# Patient Record
Sex: Male | Born: 1995 | Race: White | Hispanic: No | Marital: Married | Smoking: Never smoker
Health system: Southern US, Community
[De-identification: ages and names within clinical notes are randomized; demographics above are authoritative.]

---
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
## 2019-10-10 ENCOUNTER — Encounter: Payer: Self-pay | Admitting: Podiatry

## 2019-10-10 ENCOUNTER — Ambulatory Visit (INDEPENDENT_AMBULATORY_CARE_PROVIDER_SITE_OTHER): Payer: BC Managed Care – PPO

## 2019-10-10 ENCOUNTER — Other Ambulatory Visit: Payer: Self-pay | Admitting: Podiatry

## 2019-10-10 ENCOUNTER — Ambulatory Visit (INDEPENDENT_AMBULATORY_CARE_PROVIDER_SITE_OTHER): Payer: BC Managed Care – PPO | Admitting: Podiatry

## 2019-10-10 ENCOUNTER — Other Ambulatory Visit: Payer: Self-pay

## 2019-10-10 DIAGNOSIS — M659 Synovitis and tenosynovitis, unspecified: Secondary | ICD-10-CM

## 2019-10-10 DIAGNOSIS — M65979 Unspecified synovitis and tenosynovitis, unspecified ankle and foot: Secondary | ICD-10-CM

## 2019-10-10 DIAGNOSIS — M779 Enthesopathy, unspecified: Secondary | ICD-10-CM

## 2019-10-10 MED ORDER — DICLOFENAC SODIUM 75 MG PO TBEC: 75.0000 mg | DELAYED_RELEASE_TABLET | Freq: Two times a day (BID) | ORAL | 2 refills | Status: DC

## 2019-10-11 NOTE — Progress Notes (Signed)
Subjective:   Patient ID: Jeremy Silva, male   DOB: 24 y.o.   MRN: [id number]   HPI Patient states my right ankle has been swelling and it pops when I am flexing.  Patient states throbs worse when sitting does not remember specific injury and has been going on for several months.  Patient does not smoke and would like to be more active   Review of Systems  All other systems reviewed and are negative.       Objective:  Physical Exam Vitals and nursing note reviewed.  Constitutional:      Appearance: He is well-developed.  Pulmonary:     Effort: Pulmonary effort is normal.  Musculoskeletal:        General: Normal range of motion.  Skin:    General: Skin is warm.  Neurological:     Mental Status: He is alert.     Neurovascular status intact muscle strength was found to be adequate range of motion within normal limits.  Patient has exquisite discomfort in the sinus tarsi right with inflammation fluid and seems to have some slight reduction of motion subtalar joint.  Mild equinus condition noted bilateral with no other significant pathology     Assessment:  Possibility for sinus tarsitis acute nature right versus arthritis or other bone pathology or soft tissue pathology     Plan:  H&P conditions reviewed and today I did sterile prep and injected the sinus tarsi right 3 mg Kenalog 5 mg Xylocaine applied fascial brace to reduce motion and instructed on gradual increase in activity levels.  Placed on diclofenac 75 mg twice daily and reappoint 3 weeks  X-ray indicates no signs of fracture or advanced arthritic process

## 2019-10-21 ENCOUNTER — Other Ambulatory Visit: Payer: Self-pay

## 2019-10-21 ENCOUNTER — Emergency Department (HOSPITAL_COMMUNITY)
Admission: EM | Admit: 2019-10-21 | Discharge: 2019-10-21 | Disposition: A | Discharge status: Home / Self Care | Payer: BC Managed Care – PPO | Attending: Emergency Medicine | Admitting: Emergency Medicine

## 2019-10-21 ENCOUNTER — Emergency Department (HOSPITAL_COMMUNITY): Payer: BC Managed Care – PPO

## 2019-10-21 DIAGNOSIS — Y999 Unspecified external cause status: Secondary | ICD-10-CM | POA: Insufficient documentation

## 2019-10-21 DIAGNOSIS — S61011A Laceration without foreign body of right thumb without damage to nail, initial encounter: Secondary | ICD-10-CM | POA: Diagnosis present

## 2019-10-21 DIAGNOSIS — Y93G1 Activity, food preparation and clean up: Secondary | ICD-10-CM | POA: Diagnosis not present

## 2019-10-21 DIAGNOSIS — Y929 Unspecified place or not applicable: Secondary | ICD-10-CM | POA: Diagnosis not present

## 2019-10-21 DIAGNOSIS — W274XXA Contact with kitchen utensil, initial encounter: Secondary | ICD-10-CM | POA: Diagnosis not present

## 2019-10-21 DIAGNOSIS — S6990XA Unspecified injury of unspecified wrist, hand and finger(s), initial encounter: Secondary | ICD-10-CM

## 2019-10-21 MED ORDER — CEPHALEXIN 500 MG PO CAPS: 500.0000 mg | ORAL_CAPSULE | Freq: Three times a day (TID) | ORAL | 0 refills | Status: AC

## 2019-10-21 MED ORDER — ACETAMINOPHEN 325 MG PO TABS: 650.0000 mg | ORAL_TABLET | Freq: Four times a day (QID) | ORAL | Status: DC

## 2019-10-21 MED ORDER — MORPHINE SULFATE (PF) 4 MG/ML IV SOLN
4.0000 mg | Freq: Once | INTRAVENOUS | Status: AC
  Administered 2019-10-21: 14:00:00 4 mg via INTRAVENOUS
  Filled 2019-10-21: qty 1

## 2019-10-21 MED ORDER — OXYCODONE HCL 5 MG PO TABS: 5.0000 mg | ORAL_TABLET | Freq: Four times a day (QID) | ORAL | 0 refills | Status: DC | PRN

## 2019-10-21 NOTE — ED Triage Notes (Signed)
Pt reports curring fingertip off with mandolin slicer. Seen at [facility] and given tetanus shot. VSS.

## 2019-10-21 NOTE — ED Provider Notes (Addendum)
[facility] EMERGENCY DEPARTMENT Provider Note   CSN: [id number] Arrival date & time: 10/21/19  1321     History Chief Complaint  Patient presents with  . Finger Injury    Jeremy Silva is a 24 y.o. male with no relevant PMH who presents to the ED with an injury to his right arm.  Patient was reportedly using an ankle and slicer to cut shoes when he sliced off the fat pad of his right thumb.  Patient initially went to an urgent care who subsequently referred him to the ED to rule out bony injury.  Hemostasis has not yet been achieved, despite compression wrap.  Patient denies any lightheadedness or dizziness.  He reports 9 out of 10 pain.  He denies any numbness or tingling, diminished ROM, or other injury.  No history of bleeding disorder or blood thinners.  HPI     No past medical history on file.  There are no problems to display for this patient.   No past surgical history on file.     No family history on file.  Social History   Tobacco Use  . Smoking status: Never Smoker  . Smokeless tobacco: Never Used  Substance Use Topics  . Alcohol use: Not on file  . Drug use: Not on file    Home Medications Prior to Admission medications   Medication Sig Start Date End Date Taking? Authorizing Provider  acetaminophen (TYLENOL) 325 MG tablet Take 2 tablets (650 mg total) by mouth every 6 (six) hours. 10/21/19   Mack Hook, MD  cephALEXin (KEFLEX) 500 MG capsule Take 1 capsule (500 mg total) by mouth 3 (three) times daily for 7 days. 10/21/19 10/28/19  Mack Hook, MD  diclofenac (VOLTAREN) 75 MG EC tablet Take 1 tablet (75 mg total) by mouth 2 (two) times daily. 10/10/19   Lenn Sink, DPM  oxyCODONE (ROXICODONE) 5 MG immediate release tablet Take 1 tablet (5 mg total) by mouth every 6 (six) hours as needed for severe pain. 10/21/19   Mack Hook, MD    Allergies    Patient has no known allergies.  Review of Systems   Review of Systems    Constitutional: Negative for chills and fever.  Musculoskeletal: Negative for joint swelling.  Skin: Positive for wound.  Neurological: Negative for numbness.    Physical Exam Updated Vital Signs BP (!) 158/102 (BP Location: Left Arm)   Pulse 95   Temp 98.4 F (36.9 C) (Oral)   Resp 16   Ht 6\' 2"  (1.88 m)   Wt 108.9 kg   SpO2 100%   BMI 30.81 kg/m   Physical Exam Vitals and nursing note reviewed. Exam conducted with a chaperone present.  Constitutional:      Appearance: Normal appearance.  HENT:     Head: Normocephalic and atraumatic.  Eyes:     General: No scleral icterus.    Conjunctiva/sclera: Conjunctivae normal.  Pulmonary:     Effort: Pulmonary effort is normal.  Musculoskeletal:     Comments: Right hand, thumb: 2 x 2 centimeter bleeding wound distal to IP joint over fat pad.  No nailbed involvement.  Sensation intact proximally.  MCP joint: no diminished ROM or strength.  No spreading erythema or swelling.   Skin:    General: Skin is dry.  Neurological:     Mental Status: He is alert.     GCS: GCS eye subscore is 4. GCS verbal subscore is 5. GCS motor subscore is 6.  Psychiatric:        Mood and Affect: Mood normal.        Behavior: Behavior normal.        Thought Content: Thought content normal.           ED Results / Procedures / Treatments   Labs (all labs ordered are listed, but only abnormal results are displayed) Labs Reviewed - No data to display  EKG None  Radiology DG Finger Thumb Right  Result Date: 10/21/2019 CLINICAL DATA:  Laceration right thumb EXAM: RIGHT THUMB 2+V COMPARISON:  None FINDINGS: Extensive dressing artifacts. Osseous mineralization normal. Joint spaces preserved. No acute fracture, dislocation, or bone destruction. IMPRESSION: No acute osseous abnormalities. Electronically Signed   By: Lavonia Dana M.D.   On: 10/21/2019 14:19    Procedures Procedures (including critical care time)  Medications Ordered in  ED Medications  morphine 4 MG/ML injection 4 mg (4 mg Intravenous Given 10/21/19 1406)    ED Course  I have reviewed the triage vital signs and the nursing notes.  Pertinent labs & imaging results that were available during my care of the patient were reviewed by me and considered in my medical decision making (see chart for details).    MDM Rules/Calculators/A&P                      Hemostasis has not been achieved despite compression wrap.  DG thumb obtained which demonstrated no osseous abnormalities or involvement.  Tetanus Boostrix injection provided at urgent care given his unknown tetanus status.  Consulted hand surgery to evaluate patient.  Hilbert Odor, PA-C and Dr. Grandville Silos, hand surgeon, evaluated patient at bedside.  Plan is for him to follow-up with Dr. Grandville Silos on Thursday, 10/24/19, and then have corrective surgery the following day.  Will wrap with Xeroform dressing, 2 x 2 gauze, and Coban.  Patient is otherwise safe for discharge.  Decided against I-stat 8 as results would likely be unreliable given acuity of his bleeding.  While patient endorses significant bleeding, he denies any lightheadedness, dizziness, or chest discomfort that might otherwise be concerning for anemia.  Patient is hemodynamically stable and in no acute distress. Safe for discharge at this time with outpatient follow-up with Dr. Grandville Silos.  Encouraged to continue elevating his hand.    Final Clinical Impression(s) / ED Diagnoses Final diagnoses:  Thumb injury, initial encounter    Rx / DC Orders ED Discharge Orders         Ordered    acetaminophen (TYLENOL) 325 MG tablet  Every 6 hours     10/21/19 1504    oxyCODONE (ROXICODONE) 5 MG immediate release tablet  Every 6 hours PRN     10/21/19 1504    cephALEXin (KEFLEX) 500 MG capsule  3 times daily     10/21/19 1504           Corena Herter, PA-C 10/21/19 1606    Corena Herter, PA-C 10/21/19 1614    Little, Wenda Overland,  MD 10/22/19 [phone]

## 2019-10-21 NOTE — Consult Note (Addendum)
Reason for Consult:Right thumb injury Referring Physician: R Little  Haitham Dolinsky is an 24 y.o. male.  HPI: Jeremy Silva was making lunch today and slicing cheese on a mandolin when he cut his thumb. He came to the ED and hand surgery was consulted. 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  No past medical history on file.  No past surgical history on file.  No family history on file.  Social History:  reports that he has never smoked. He has never used smokeless tobacco. No history on file for alcohol and drug.  Allergies: No Known Allergies  Medications: I have reviewed the patient's current medications.  No results found for this or any previous visit (from the past 48 hour(s)).  DG Finger Thumb Right  Result Date: 10/21/2019 CLINICAL DATA:  Laceration right thumb EXAM: RIGHT THUMB 2+V COMPARISON:  None FINDINGS: Extensive dressing artifacts. Osseous mineralization normal. Joint spaces preserved. No acute fracture, dislocation, or bone destruction. IMPRESSION: No acute osseous abnormalities. Electronically Signed   By: Ulyses Southward M.D.   On: 10/21/2019 14:19    Review of Systems  HENT: Negative for ear discharge, ear pain, hearing loss and tinnitus.   Eyes: Negative for photophobia and pain.  Respiratory: Negative for cough and shortness of breath.   Cardiovascular: Negative for chest pain.  Gastrointestinal: Negative for abdominal pain, nausea and vomiting.  Genitourinary: Negative for dysuria, flank pain, frequency and urgency.  Musculoskeletal: Positive for myalgias (Right thumb). Negative for back pain and neck pain.  Neurological: Negative for dizziness and headaches.  Hematological: Does not bruise/bleed easily.  Psychiatric/Behavioral: The patient is not nervous/anxious.    Blood pressure (!) 158/102, pulse 95, temperature 98.4 F (36.9 C), temperature source Oral, resp. rate 16, height 6\' 2"  (1.88 m), weight 108.9 kg, SpO2 100 %. Physical Exam  Constitutional: He  appears well-developed and well-nourished. No distress.  HENT:  Head: Normocephalic and atraumatic.  Eyes: Conjunctivae are normal. Right eye exhibits no discharge. Left eye exhibits no discharge. No scleral icterus.  Cardiovascular: Normal rate and regular rhythm.  Respiratory: Effort normal. No respiratory distress.  Musculoskeletal:     Cervical back: Normal range of motion.     Comments: Right shoulder, elbow, wrist, digits- Annular lac pad of thumb, no instability, no blocks to motion  Sens  Ax/R/M/U intact  Mot   Ax/ R/ PIN/ M/ AIN/ U intact  Rad 2+  Neurological: He is alert.  Skin: Skin is warm and dry. He is not diaphoretic.  Psychiatric: He has a normal mood and affect. His behavior is normal.    Assessment/Plan: Right thumb lac -- Xeroform dressing and f/u with Dr. later this week to discuss surgery.    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  IP joint appears to flex normally.  I discussed with him this injury and multiple options for proceeding, from healing via secondary intent to flap coverage.  We will plan to reevaluate him later this week and determine how best to proceed given all factors.

## 2019-10-21 NOTE — Discharge Instructions (Addendum)
Leave bandage until your office appt.  WORK STATUS:  NO WORK UNTIL AT LEAST FOLLOWING SURGERY FOR R THUMB  Keep that hand elevated the rest of today/tonight! Drink plenty of fluids.

## 2019-10-21 NOTE — ED Notes (Signed)
Patient Alert and oriented to baseline. Stable and ambulatory to baseline. Patient verbalized understanding of the discharge instructions.  Patient belongings were taken by the patient.   

## 2021-11-01 IMAGING — CR DG FINGER THUMB 2+V*R*
3 series · 3 of 3 positions shown · non-contrast
Comparison: None

CLINICAL DATA: Laceration right thumb

EXAM:
RIGHT THUMB 2+V

[finger ap]
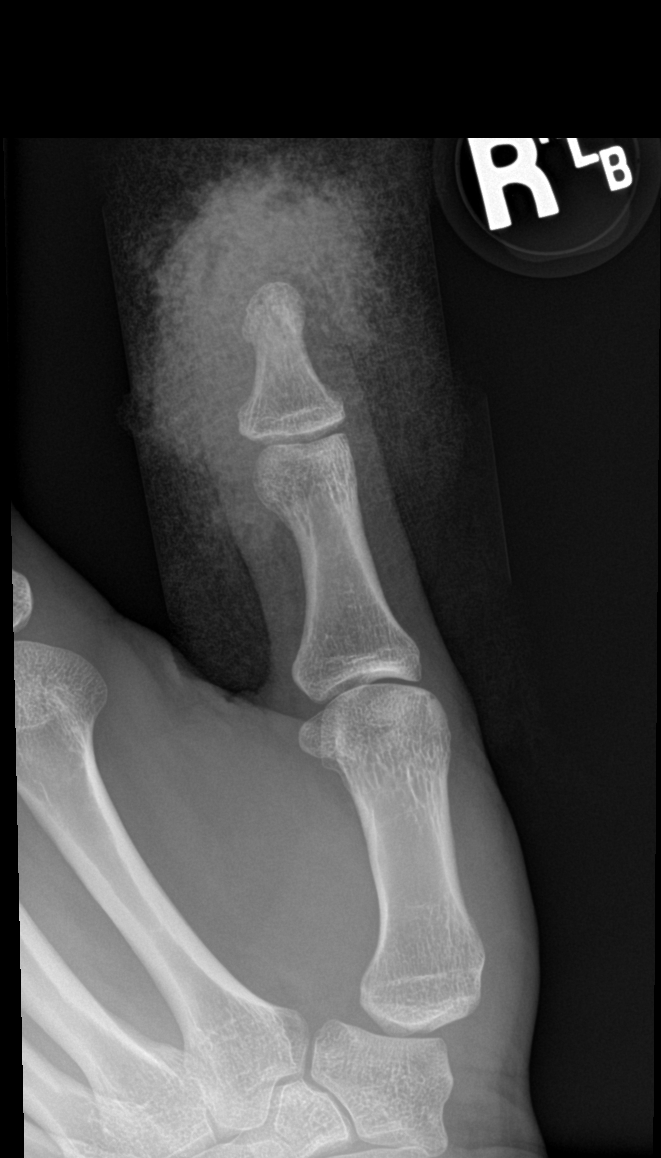

[finger obl]
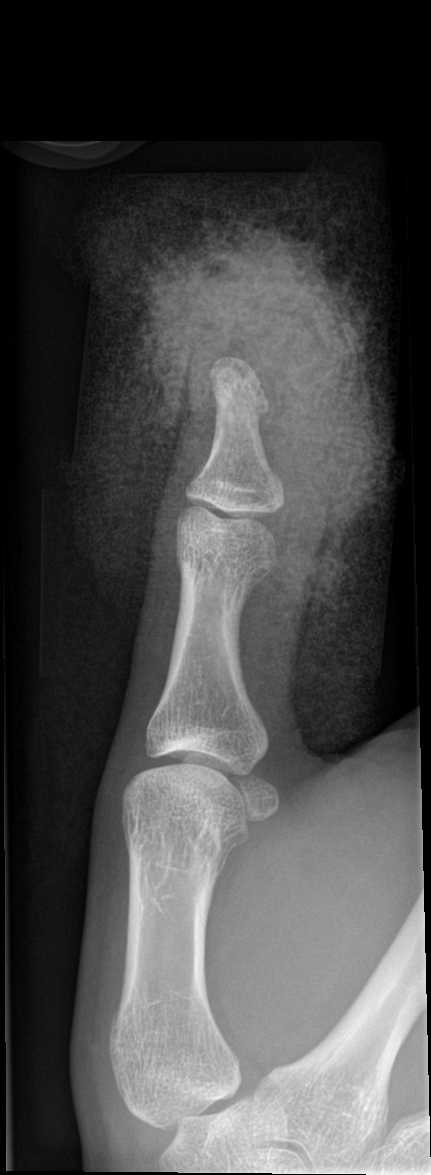

[finger lat]
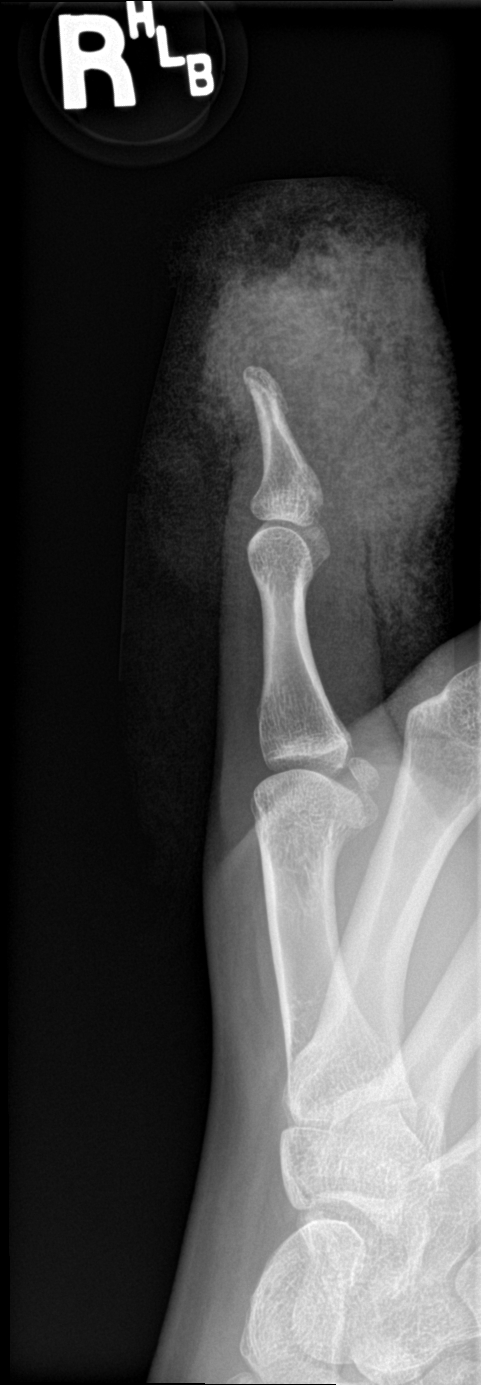

[3 of 3 positions shown; findings below may reference images not displayed]

FINDINGS: Extensive dressing artifacts.

Osseous mineralization normal.

Joint spaces preserved.

No acute fracture, dislocation, or bone destruction.
IMPRESSION: No acute osseous abnormalities.

## 2023-09-18 ENCOUNTER — Ambulatory Visit (INDEPENDENT_AMBULATORY_CARE_PROVIDER_SITE_OTHER): Payer: Managed Care, Other (non HMO)

## 2023-09-18 ENCOUNTER — Encounter: Payer: Self-pay | Admitting: Podiatry

## 2023-09-18 ENCOUNTER — Ambulatory Visit: Payer: Managed Care, Other (non HMO) | Admitting: Podiatry

## 2023-09-18 VITALS — Ht 74.0 in | Wt 240.0 lb

## 2023-09-18 DIAGNOSIS — D3610 Benign neoplasm of peripheral nerves and autonomic nervous system, unspecified: Secondary | ICD-10-CM | POA: Diagnosis not present

## 2023-09-18 DIAGNOSIS — M79671 Pain in right foot: Secondary | ICD-10-CM

## 2023-09-18 DIAGNOSIS — M2170 Unequal limb length (acquired), unspecified site: Secondary | ICD-10-CM

## 2023-09-18 MED ORDER — TRIAMCINOLONE ACETONIDE 10 MG/ML IJ SUSP
10.0000 mg | Freq: Once | INTRAMUSCULAR | Status: AC
  Administered 2023-09-18: 10 mg via INTRA_ARTICULAR

## 2023-09-18 MED ORDER — DICLOFENAC SODIUM 75 MG PO TBEC: 75.0000 mg | DELAYED_RELEASE_TABLET | Freq: Two times a day (BID) | ORAL | 2 refills | Status: AC

## 2023-09-18 NOTE — Progress Notes (Signed)
Subjective:   Patient ID: Jeremy Silva, male   DOB: 27 y.o.   MRN: [id number]   HPI Patient presents with forefoot pain right and states that the inside of the big toe and inside the second toe are numb and bothersome for him and patient states he does not remember an injury to the area.  He does not smoke likes to be active and has been running half marathons   Review of Systems  All other systems reviewed and are negative.       Objective:  Physical Exam Vitals and nursing note reviewed.  Constitutional:      Appearance: He is well-developed.  Pulmonary:     Effort: Pulmonary effort is normal.  Musculoskeletal:        General: Normal range of motion.  Skin:    General: Skin is warm.  Neurological:     Mental Status: He is alert.     Neurovascular status found to be intact muscle strength was found to be adequate range of motion within normal limits.  Patient is noted to have loss of sensation to the lateral side hallux medial side right second toe with discomfort in the interspace first with no range of motion loss or pain of joint surface.  Slight prominence around the first metatarsal cuneiform     Assessment:  Probability of some kind of nerve inflammation of this area cannot rule out any kind of bony contusion     Plan:  H&P x-ray reviewed and I went ahead today and I did inject the first interspace 3 mg dexamethasone Kenalog 5 mg Xylocaine and I did find there also to be a limb length and I put him into a heel lift on the left side.  Patient will be seen back as symptoms indicate may require nerve conduction EMG if symptoms persist or a look whether or not there could be any back pathology  X-rays indicate slight enlargement around the first metatarsal cuneiform joint not severe

## 2023-10-11 DIAGNOSIS — I10 Essential (primary) hypertension: Secondary | ICD-10-CM | POA: Diagnosis not present

## 2023-12-11 ENCOUNTER — Telehealth: Admitting: Physician Assistant

## 2023-12-11 DIAGNOSIS — J208 Acute bronchitis due to other specified organisms: Secondary | ICD-10-CM

## 2023-12-11 DIAGNOSIS — I10 Essential (primary) hypertension: Secondary | ICD-10-CM | POA: Insufficient documentation

## 2023-12-11 MED ORDER — BENZONATATE 100 MG PO CAPS: 100.0000 mg | ORAL_CAPSULE | Freq: Three times a day (TID) | ORAL | 0 refills | Status: DC | PRN

## 2023-12-11 MED ORDER — PREDNISONE 10 MG (21) PO TBPK: ORAL_TABLET | ORAL | 0 refills | Status: AC

## 2023-12-11 MED ORDER — ALBUTEROL SULFATE HFA 108 (90 BASE) MCG/ACT IN AERS: 1.0000 | INHALATION_SPRAY | Freq: Four times a day (QID) | RESPIRATORY_TRACT | 0 refills | Status: AC | PRN

## 2023-12-11 NOTE — Progress Notes (Signed)
 E-Visit for Cough   We are sorry that you are not feeling well.  Here is how we plan to help!  Based on your presentation I believe you most likely have A cough due to a virus.  This is called viral bronchitis and is best treated by rest, plenty of fluids and control of the cough.  You may use Ibuprofen or Tylenol as directed to help your symptoms.     In addition you may use A non-prescription cough medication called Mucinex DM: take 2 tablets every 12 hours. and A prescription cough medication called Tessalon Perles 100mg . You may take 1-2 capsules every 8 hours as needed for your cough.  I have prescribed:  Prednisone 10 mg daily for 6 days (see taper instructions below)  Directions for 6 day taper: Day 1: 2 tablets before breakfast, 1 after both lunch & dinner and 2 at bedtime Day 2: 1 tab before breakfast, 1 after both lunch & dinner and 2 at bedtime Day 3: 1 tab at each meal & 1 at bedtime Day 4: 1 tab at breakfast, 1 at lunch, 1 at bedtime Day 5: 1 tab at breakfast & 1 tab at bedtime Day 6: 1 tab at breakfast  AND  Albuterol inhaler Use 1-2 puffs every 6 hours as needed for shortness of breath, chest tightness, and/or wheezing.  From your responses in the eVisit questionnaire you describe inflammation in the upper respiratory tract which is causing a significant cough.  This is commonly called Bronchitis and has four common causes:   Allergies Viral Infections Acid Reflux Bacterial Infection Allergies, viruses and acid reflux are treated by controlling symptoms or eliminating the cause. An example might be a cough caused by taking certain blood pressure medications. You stop the cough by changing the medication. Another example might be a cough caused by acid reflux. Controlling the reflux helps control the cough.  USE OF BRONCHODILATOR ("RESCUE") INHALERS: There is a risk from using your bronchodilator too frequently.  The risk is that over-reliance on a medication which only  relaxes the muscles surrounding the breathing tubes can reduce the effectiveness of medications prescribed to reduce swelling and congestion of the tubes themselves.  Although you feel brief relief from the bronchodilator inhaler, your asthma may actually be worsening with the tubes becoming more swollen and filled with mucus.  This can delay other crucial treatments, such as oral steroid medications. If you need to use a bronchodilator inhaler daily, several times per day, you should discuss this with your provider.  There are probably better treatments that could be used to keep your asthma under control.     HOME CARE Only take medications as instructed by your medical team. Complete the entire course of an antibiotic. Drink plenty of fluids and get plenty of rest. Avoid close contacts especially the very young and the elderly Cover your mouth if you cough or cough into your sleeve. Always remember to wash your hands A steam or ultrasonic humidifier can help congestion.   GET HELP RIGHT AWAY IF: You develop worsening fever. You become short of breath You cough up blood. Your symptoms persist after you have completed your treatment plan MAKE SURE YOU  Understand these instructions. Will watch your condition. Will get help right away if you are not doing well or get worse.    Thank you for choosing an e-visit.  Your e-visit answers were reviewed by a board certified advanced clinical practitioner to complete your personal care plan. Depending  upon the condition, your plan could have included both over the counter or prescription medications.  Please review your pharmacy choice. Make sure the pharmacy is open so you can pick up prescription now. If there is a problem, you may contact your provider through [employer] and have the prescription routed to another pharmacy.  Your safety is important to [place]. If you have drug allergies check your prescription carefully.   For the next 24  hours you can use MyChart to ask questions about today's visit, request a non-urgent call back, or ask for a work or school excuse. You will get an email in the next two days asking about your experience. I hope that your e-visit has been valuable and will speed your recovery.   I have spent 5 minutes in review of e-visit questionnaire, review and updating patient chart, medical decision making and response to patient.   Margaretann Loveless, PA-C

## 2024-01-12 ENCOUNTER — Telehealth: Admitting: Emergency Medicine

## 2024-01-12 DIAGNOSIS — R051 Acute cough: Secondary | ICD-10-CM

## 2024-01-12 MED ORDER — AMOXICILLIN-POT CLAVULANATE 875-125 MG PO TABS: 1.0000 | ORAL_TABLET | Freq: Two times a day (BID) | ORAL | 0 refills | Status: AC

## 2024-01-12 MED ORDER — BENZONATATE 100 MG PO CAPS: 100.0000 mg | ORAL_CAPSULE | Freq: Two times a day (BID) | ORAL | 0 refills | Status: AC | PRN

## 2024-01-12 NOTE — Progress Notes (Signed)
 E-Visit for Cough   We are sorry that you are not feeling well.  Here is how we plan to help!  Based on your presentation I believe you most likely have A cough due to bacteria.  When patients have a fever and a productive cough with a change in color or increased sputum production, we are concerned about bacterial bronchitis.  If left untreated it can progress to pneumonia.  If your symptoms do not improve with your treatment plan it is important that you contact your provider.   I've prescribed Augmentin 875mg .   In addition you may use A prescription cough medication called Tessalon Perles 100mg . You may take 1-2 capsules every 8 hours as needed for your cough.    From your responses in the eVisit questionnaire you describe inflammation in the upper respiratory tract which is causing a significant cough.  This is commonly called Bronchitis and has four common causes:   Allergies Viral Infections Acid Reflux Bacterial Infection Allergies, viruses and acid reflux are treated by controlling symptoms or eliminating the cause. An example might be a cough caused by taking certain blood pressure medications. You stop the cough by changing the medication. Another example might be a cough caused by acid reflux. Controlling the reflux helps control the cough.  USE OF BRONCHODILATOR ("RESCUE") INHALERS: There is a risk from using your bronchodilator too frequently.  The risk is that over-reliance on a medication which only relaxes the muscles surrounding the breathing tubes can reduce the effectiveness of medications prescribed to reduce swelling and congestion of the tubes themselves.  Although you feel brief relief from the bronchodilator inhaler, your asthma may actually be worsening with the tubes becoming more swollen and filled with mucus.  This can delay other crucial treatments, such as oral steroid medications. If you need to use a bronchodilator inhaler daily, several times per day, you should  discuss this with your provider.  There are probably better treatments that could be used to keep your asthma under control.     HOME CARE Only take medications as instructed by your medical team. Complete the entire course of an antibiotic. Drink plenty of fluids and get plenty of rest. Avoid close contacts especially the very young and the elderly Cover your mouth if you cough or cough into your sleeve. Always remember to wash your hands A steam or ultrasonic humidifier can help congestion.   GET HELP RIGHT AWAY IF: You develop worsening fever. You become short of breath You cough up blood. Your symptoms persist after you have completed your treatment plan MAKE SURE YOU  Understand these instructions. Will watch your condition. Will get help right away if you are not doing well or get worse.    Thank you for choosing an e-visit.  Your e-visit answers were reviewed by a board certified advanced clinical practitioner to complete your personal care plan. Depending upon the condition, your plan could have included both over the counter or prescription medications.  Please review your pharmacy choice. Make sure the pharmacy is open so you can pick up prescription now. If there is a problem, you may contact your provider through [employer] and have the prescription routed to another pharmacy.  Your safety is important to [place]. If you have drug allergies check your prescription carefully.   For the next 24 hours you can use MyChart to ask questions about today's visit, request a non-urgent call back, or ask for a work or school excuse. You will get  an email in the next two days asking about your experience. I hope that your e-visit has been valuable and will speed your recovery.  Approximately 5 minutes was used in reviewing the patient's chart, questionnaire, prescribing medications, and documentation.

## 2024-03-18 DIAGNOSIS — I10 Essential (primary) hypertension: Secondary | ICD-10-CM | POA: Diagnosis not present

## 2024-04-01 DIAGNOSIS — I10 Essential (primary) hypertension: Secondary | ICD-10-CM | POA: Diagnosis not present

## 2024-04-26 DIAGNOSIS — F4320 Adjustment disorder, unspecified: Secondary | ICD-10-CM | POA: Diagnosis not present

## 2024-05-07 DIAGNOSIS — F4320 Adjustment disorder, unspecified: Secondary | ICD-10-CM | POA: Diagnosis not present

## 2024-06-14 DIAGNOSIS — Z23 Encounter for immunization: Secondary | ICD-10-CM | POA: Diagnosis not present
